# Patient Record
Sex: Female | Born: 1960 | Race: Asian | Hispanic: No | Marital: Married | Smoking: Never smoker
Health system: Southern US, Community
[De-identification: ages and names within clinical notes are randomized; demographics above are authoritative.]

## PROBLEM LIST (undated history)

## (undated) DIAGNOSIS — E785 Hyperlipidemia, unspecified: Secondary | ICD-10-CM

## (undated) DIAGNOSIS — E039 Hypothyroidism, unspecified: Secondary | ICD-10-CM

## (undated) DIAGNOSIS — I10 Essential (primary) hypertension: Secondary | ICD-10-CM

---

## 2015-11-20 ENCOUNTER — Encounter (HOSPITAL_COMMUNITY): Payer: Self-pay | Admitting: *Deleted

## 2015-11-20 ENCOUNTER — Encounter (HOSPITAL_COMMUNITY): Payer: Self-pay | Admitting: Student

## 2015-11-20 ENCOUNTER — Inpatient Hospital Stay (HOSPITAL_COMMUNITY)
Admission: AD | Admit: 2015-11-20 | Discharge: 2015-11-20 | Disposition: A | Discharge status: Home / Self Care | Payer: Self-pay | Source: Ambulatory Visit | Attending: Obstetrics and Gynecology | Admitting: Obstetrics and Gynecology

## 2015-11-20 ENCOUNTER — Emergency Department (HOSPITAL_COMMUNITY)
Admission: EM | Admit: 2015-11-20 | Discharge: 2015-11-20 | Disposition: A | Discharge status: Home / Self Care | Payer: Self-pay | Attending: Emergency Medicine | Admitting: Emergency Medicine

## 2015-11-20 DIAGNOSIS — E039 Hypothyroidism, unspecified: Secondary | ICD-10-CM | POA: Insufficient documentation

## 2015-11-20 DIAGNOSIS — E785 Hyperlipidemia, unspecified: Secondary | ICD-10-CM | POA: Insufficient documentation

## 2015-11-20 DIAGNOSIS — I10 Essential (primary) hypertension: Secondary | ICD-10-CM | POA: Insufficient documentation

## 2015-11-20 DIAGNOSIS — R21 Rash and other nonspecific skin eruption: Secondary | ICD-10-CM | POA: Insufficient documentation

## 2015-11-20 HISTORY — DX: Hypothyroidism, unspecified: E03.9

## 2015-11-20 HISTORY — DX: Hyperlipidemia, unspecified: E78.5

## 2015-11-20 HISTORY — DX: Essential (primary) hypertension: I10

## 2015-11-20 MED ORDER — TRIAMCINOLONE ACETONIDE 0.025 % EX OINT: 1.0000 "application " | TOPICAL_OINTMENT | Freq: Two times a day (BID) | CUTANEOUS | Status: AC

## 2015-11-20 MED ORDER — CEPHALEXIN 500 MG PO CAPS: 500.0000 mg | ORAL_CAPSULE | Freq: Four times a day (QID) | ORAL | Status: AC

## 2015-11-20 NOTE — MAU Note (Signed)
approx 3-5in red area on left shin, no blisters noted now. Is shiny from ointment.

## 2015-11-20 NOTE — MAU Note (Addendum)
First noted about a week ago. ? Abrasion/bruise on left outer calf.  Started blistering, they popped, she tried to clean it and used topical ointment. Has a couple small dot like spots, ? Bites on foreams today noted itching and spots on buttocks and low back. No blisters noted.  Itching, no pain.

## 2015-11-20 NOTE — MAU Provider Note (Signed)
  History     CSN: [id number]  Arrival date and time: 11/20/15 1559   First Provider Initiated Contact with Patient 11/20/15 1622      Chief Complaint  Patient presents with  . Rash   HPI Lacey Cole is a 55 y.o. female who presents for rash. Symptoms began more than 1 week ago. Noticed 2 small blisters on side of left lower leg that drained. Since then rash spread over leg & has been itching. Has also noticed small bumps on bilateral arms & lower back/buttocks area.  [facility] & was told to go directly to hospital for evaluation.   OB History    No data available      Past Medical History  Diagnosis Date  . Hypothyroidism   . Hypertension   . Hyperlipidemia     No past surgical history on file.  No family history on file.  Social History  Substance Use Topics  . Smoking status: Not on file  . Smokeless tobacco: Not on file  . Alcohol Use: Not on file    Allergies: No Known Allergies  Prescriptions prior to admission  Medication Sig Dispense Refill Last Dose  . levothyroxine (SYNTHROID, LEVOTHROID) 25 MCG tablet Take 25 mcg by mouth daily before breakfast.   11/19/2015 at Unknown time  . neomycin-bacitracin-polymyxin (NEOSPORIN) ointment Apply 1 application topically 2 (two) times daily as needed for wound care. apply to eye   11/20/2015 at Unknown time  . PRESCRIPTION MEDICATION Take 1 tablet by mouth at bedtime.   11/20/2015 at Unknown time    Review of Systems  Constitutional: Negative for fever and chills.  Skin: Positive for itching and rash.   Physical Exam   Blood pressure 157/77, pulse 71, temperature 98.4 F (36.9 C), temperature source Oral, resp. rate 16.  Physical Exam  Constitutional: She appears well-developed and well-nourished. No distress.  HENT:  Head: Normocephalic and atraumatic.  Respiratory: Effort normal. No respiratory distress.  Skin: Rash noted. She is not diaphoretic. There is erythema.  Large  erythematous area on lateral side of left lower extremity (~10cm). Area is warm to touch.  Multiple pinpoint lesions on bilateral arms and buttocks.     MAU Course  Procedures  MDM VSS & pt in no acute distress.  Discussed with patient & daughter [facility] for possible cellulitis of her leg. Pt agreeable to plan.   Assessment and Plan  A: 1. Rash and nonspecific skin eruption     P: Discharge in stable condition Pt [facility]  Judeth Hornrin Winston Sobczyk 11/20/2015, 4:33 PM

## 2015-11-20 NOTE — Discharge Instructions (Signed)

## 2015-11-20 NOTE — ED Notes (Signed)
Pt's daughter reports rash on pt's LLE x 1 week, started to have them on her buttocks today.  Large area noted on her LLE, redness noted and pt reports warm to touch.  [facility] and was instructed to come to the ED for ?cellulitis.

## 2015-11-20 NOTE — ED Provider Notes (Signed)
CSN: [id number]     Arrival date & time 11/20/15  1652 History  By signing my name below, I, Majel HomerPeyton Lee, attest that this documentation has been prepared under the direction and in the presence of non-physician practitioner, Terance HartKelly Jeraldine Primeau, PA-C. Electronically Signed: Majel HomerPeyton Lee, Scribe. 11/20/2015. 5:46 PM.  Chief Complaint  Patient presents with  . Rash   The history is provided by the patient and a relative. No language interpreter was used.   HPI Comments:  Lacey Cole is a 55 y.o. Female with PMHx of HTN and hypothyroidism, who presents to the Emergency Department complaining of gradual onset, gradually worsening, itchy, rash that began ~1.5 weeks PTA. Per daughter, the rash started as a painful, bloody spot on her lower left leg. Pt's daughter states it started as a fluid filled blister which popped. Since then the rash has progressed and has spread on to her bilateral buttocks, lower back, and bilateral arms. The rash is warm to touch. Her daughter reports that pt was [facility] this afternoon for similar symptoms and was advised to come to the ED. She also notes that her rash has begun to spread to other areas of her body and looks similar to small bug bites. Per daughter, the pt was gardening around time of onset. She denies fever, recent changes in medication and any other complaints.   Past Medical History  Diagnosis Date  . Hypothyroidism   . Hypertension   . Hyperlipidemia    History reviewed. No pertinent past surgical history. No family history on file. Social History  Substance Use Topics  . Smoking status: Never Smoker   . Smokeless tobacco: None  . Alcohol Use: No   OB History    No data available     Review of Systems  Constitutional: Negative for fever.  Skin: Positive for rash.   Allergies  Review of patient's allergies indicates no known allergies.  Home Medications   Prior to Admission medications   Medication Sig Start Date End Date  Taking? Authorizing Provider  levothyroxine (SYNTHROID, LEVOTHROID) 25 MCG tablet Take 25 mcg by mouth daily before breakfast.    Historical Provider, MD  neomycin-bacitracin-polymyxin (NEOSPORIN) ointment Apply 1 application topically 2 (two) times daily as needed for wound care. apply to eye    Historical Provider, MD  PRESCRIPTION MEDICATION Take 1 tablet by mouth at bedtime.    Historical Provider, MD   Triage Vitals: BP 158/90 mmHg  Pulse 73  Temp(Src) 98.5 F (36.9 C) (Oral)  Resp 18  SpO2 97%  Physical Exam  Constitutional: She is oriented to person, place, and time. She appears well-developed and well-nourished. No distress.  HENT:  Head: Normocephalic and atraumatic.  Eyes: Conjunctivae are normal. Pupils are equal, round, and reactive to light. Right eye exhibits no discharge. Left eye exhibits no discharge. No scleral icterus.  Neck: Normal range of motion.  Cardiovascular: Normal rate.   Pulmonary/Chest: Effort normal. No respiratory distress.  Abdominal: She exhibits no distension.  Neurological: She is alert and oriented to person, place, and time.  Skin: Skin is warm and dry. Rash noted.  Cellulitic appearing rash on the left anterior shin. No purulent drainage or open skin. Rash on bilateral buttocks and arms appears urticarial.   Psychiatric: She has a normal mood and affect. Her behavior is normal.    ED Course  Procedures  DIAGNOSTIC STUDIES:  Oxygen Saturation is 97% on RA, normal by my interpretation.    COORDINATION OF CARE:  5:41 PM Discussed treatment plan, which includes steroid cream and antibiotics for infection with pt and daughter at bedside and they agreed to plan.  Labs Review Labs Reviewed - No data to display  Imaging Review No results found. I have personally reviewed and evaluated these images and lab results as part of my medical decision-making.   EKG Interpretation None      MDM   Final diagnoses:  Rash and other nonspecific  skin eruption   55 year old female who presents with a rash. Unclear etiology. Will treat for cellulitis with antibiotics and steroid ointment for itching. Recommend close monitoring of rash daily to make sure it is not progressing and PCP follow up. Patient is NAD, non-toxic, with stable VS. Patient is informed of clinical course, understands medical decision making process, and agrees with plan. Opportunity for questions provided and all questions answered. Return precautions given.  I personally performed the services described in this documentation, which was scribed in my presence. The recorded information has been reviewed and is accurate.     Bethel Born, PA-C 11/21/15 9629  Lyndal Pulley, MD 11/21/15 1247

## 2016-02-15 ENCOUNTER — Emergency Department (HOSPITAL_COMMUNITY): Payer: Self-pay

## 2016-02-15 ENCOUNTER — Encounter (HOSPITAL_COMMUNITY): Payer: Self-pay | Admitting: Emergency Medicine

## 2016-02-15 ENCOUNTER — Emergency Department (HOSPITAL_COMMUNITY)
Admission: EM | Admit: 2016-02-15 | Discharge: 2016-02-15 | Disposition: A | Discharge status: Home / Self Care | Payer: Self-pay | Attending: Emergency Medicine | Admitting: Emergency Medicine

## 2016-02-15 DIAGNOSIS — S63502A Unspecified sprain of left wrist, initial encounter: Secondary | ICD-10-CM | POA: Insufficient documentation

## 2016-02-15 DIAGNOSIS — E039 Hypothyroidism, unspecified: Secondary | ICD-10-CM | POA: Insufficient documentation

## 2016-02-15 DIAGNOSIS — Y999 Unspecified external cause status: Secondary | ICD-10-CM | POA: Insufficient documentation

## 2016-02-15 DIAGNOSIS — Y939 Activity, unspecified: Secondary | ICD-10-CM | POA: Insufficient documentation

## 2016-02-15 DIAGNOSIS — I10 Essential (primary) hypertension: Secondary | ICD-10-CM | POA: Insufficient documentation

## 2016-02-15 DIAGNOSIS — S63501A Unspecified sprain of right wrist, initial encounter: Secondary | ICD-10-CM | POA: Insufficient documentation

## 2016-02-15 DIAGNOSIS — Y92512 Supermarket, store or market as the place of occurrence of the external cause: Secondary | ICD-10-CM | POA: Insufficient documentation

## 2016-02-15 DIAGNOSIS — S0003XA Contusion of scalp, initial encounter: Secondary | ICD-10-CM | POA: Insufficient documentation

## 2016-02-15 DIAGNOSIS — S60512A Abrasion of left hand, initial encounter: Secondary | ICD-10-CM | POA: Insufficient documentation

## 2016-02-15 DIAGNOSIS — S8002XA Contusion of left knee, initial encounter: Secondary | ICD-10-CM | POA: Insufficient documentation

## 2016-02-15 MED ORDER — NAPROXEN 375 MG PO TABS: 375.0000 mg | ORAL_TABLET | Freq: Two times a day (BID) | ORAL | 0 refills | Status: AC

## 2016-02-15 MED ORDER — IBUPROFEN 400 MG PO TABS
600.0000 mg | ORAL_TABLET | Freq: Once | ORAL | Status: AC
  Administered 2016-02-15: 600 mg via ORAL
  Filled 2016-02-15: qty 1

## 2016-02-15 MED ORDER — CYCLOBENZAPRINE HCL 5 MG PO TABS: 5.0000 mg | ORAL_TABLET | Freq: Three times a day (TID) | ORAL | 0 refills | Status: AC | PRN

## 2016-02-15 NOTE — ED Notes (Signed)
Pt did not want wrist splint
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
## 2016-02-15 NOTE — ED Notes (Signed)
Patient states she does not want wrist splint, she has two at home and does not want to pay for another one.  Laneta SimmersJessica Branch RN notified.
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
